# Patient Record
Sex: Female | Born: 1955 | Race: White | Hispanic: No | Smoking: Never smoker
Health system: Southern US, Community
[De-identification: ages and names within clinical notes are randomized; demographics above are authoritative.]

## PROBLEM LIST (undated history)

## (undated) DIAGNOSIS — I330 Acute and subacute infective endocarditis: Secondary | ICD-10-CM

## (undated) DIAGNOSIS — E785 Hyperlipidemia, unspecified: Secondary | ICD-10-CM

## (undated) DIAGNOSIS — G4733 Obstructive sleep apnea (adult) (pediatric): Secondary | ICD-10-CM

## (undated) DIAGNOSIS — I639 Cerebral infarction, unspecified: Secondary | ICD-10-CM

## (undated) DIAGNOSIS — E119 Type 2 diabetes mellitus without complications: Secondary | ICD-10-CM

## (undated) DIAGNOSIS — A4102 Sepsis due to Methicillin resistant Staphylococcus aureus: Secondary | ICD-10-CM

## (undated) DIAGNOSIS — J9621 Acute and chronic respiratory failure with hypoxia: Secondary | ICD-10-CM

## (undated) DIAGNOSIS — I2510 Atherosclerotic heart disease of native coronary artery without angina pectoris: Secondary | ICD-10-CM

## (undated) DIAGNOSIS — K279 Peptic ulcer, site unspecified, unspecified as acute or chronic, without hemorrhage or perforation: Secondary | ICD-10-CM

## (undated) DIAGNOSIS — I10 Essential (primary) hypertension: Secondary | ICD-10-CM

## (undated) HISTORY — PX: CARPAL TUNNEL RELEASE: SHX101

## (undated) HISTORY — PX: BELOW KNEE LEG AMPUTATION: SUR23

## (undated) HISTORY — PX: TRACHEOSTOMY: SUR1362

## (undated) HISTORY — PX: ANKLE FRACTURE SURGERY: SHX122

---

## 2018-03-10 ENCOUNTER — Ambulatory Visit (HOSPITAL_COMMUNITY)
Admission: AD | Admit: 2018-03-10 | Discharge: 2018-03-10 | Disposition: A | Discharge status: Home / Self Care | Payer: Medicaid Other | Source: Other Acute Inpatient Hospital | Attending: Internal Medicine | Admitting: Internal Medicine

## 2018-03-10 ENCOUNTER — Inpatient Hospital Stay
Admission: RE | Admit: 2018-03-10 | Discharge: 2018-03-30 | Disposition: A | Discharge status: Skilled Nursing Facility | Payer: 59 | Attending: Internal Medicine | Admitting: Internal Medicine

## 2018-03-10 ENCOUNTER — Other Ambulatory Visit (HOSPITAL_COMMUNITY): Payer: Self-pay

## 2018-03-10 DIAGNOSIS — I2510 Atherosclerotic heart disease of native coronary artery without angina pectoris: Secondary | ICD-10-CM

## 2018-03-10 DIAGNOSIS — J9621 Acute and chronic respiratory failure with hypoxia: Secondary | ICD-10-CM

## 2018-03-10 DIAGNOSIS — I330 Acute and subacute infective endocarditis: Secondary | ICD-10-CM

## 2018-03-10 DIAGNOSIS — Z4659 Encounter for fitting and adjustment of other gastrointestinal appliance and device: Secondary | ICD-10-CM

## 2018-03-10 DIAGNOSIS — J9600 Acute respiratory failure, unspecified whether with hypoxia or hypercapnia: Secondary | ICD-10-CM

## 2018-03-10 DIAGNOSIS — A4102 Sepsis due to Methicillin resistant Staphylococcus aureus: Secondary | ICD-10-CM

## 2018-03-10 DIAGNOSIS — J9690 Respiratory failure, unspecified, unspecified whether with hypoxia or hypercapnia: Secondary | ICD-10-CM | POA: Diagnosis present

## 2018-03-10 HISTORY — DX: Obstructive sleep apnea (adult) (pediatric): G47.33

## 2018-03-10 HISTORY — DX: Essential (primary) hypertension: I10

## 2018-03-10 HISTORY — DX: Sepsis due to methicillin resistant Staphylococcus aureus: A41.02

## 2018-03-10 HISTORY — DX: Peptic ulcer, site unspecified, unspecified as acute or chronic, without hemorrhage or perforation: K27.9

## 2018-03-10 HISTORY — DX: Cerebral infarction, unspecified: I63.9

## 2018-03-10 HISTORY — DX: Hyperlipidemia, unspecified: E78.5

## 2018-03-10 HISTORY — DX: Atherosclerotic heart disease of native coronary artery without angina pectoris: I25.10

## 2018-03-10 HISTORY — DX: Acute and subacute infective endocarditis: I33.0

## 2018-03-10 HISTORY — DX: Acute and chronic respiratory failure with hypoxia: J96.21

## 2018-03-10 HISTORY — DX: Type 2 diabetes mellitus without complications: E11.9

## 2018-03-10 LAB — BLOOD GAS, ARTERIAL
ACID-BASE EXCESS: 5.4 mmol/L — AB (ref 0.0–2.0)
Bicarbonate: 29.6 mmol/L — ABNORMAL HIGH (ref 20.0–28.0)
FIO2: 40
O2 Saturation: 99.1 %
PH ART: 7.43 (ref 7.350–7.450)
PRESSURE CONTROL: 16 cmH2O
Patient temperature: 98.6
RATE: 20 resp/min
pCO2 arterial: 45.4 mmHg (ref 32.0–48.0)
pO2, Arterial: 141 mmHg — ABNORMAL HIGH (ref 83.0–108.0)

## 2018-03-11 LAB — COMPREHENSIVE METABOLIC PANEL
ALBUMIN: 1.7 g/dL — AB (ref 3.5–5.0)
ALT: 20 U/L (ref 14–54)
ANION GAP: 10 (ref 5–15)
AST: 21 U/L (ref 15–41)
Alkaline Phosphatase: 825 U/L — ABNORMAL HIGH (ref 38–126)
BILIRUBIN TOTAL: 0.9 mg/dL (ref 0.3–1.2)
BUN: 31 mg/dL — ABNORMAL HIGH (ref 6–20)
CO2: 26 mmol/L (ref 22–32)
Calcium: 8.4 mg/dL — ABNORMAL LOW (ref 8.9–10.3)
Chloride: 105 mmol/L (ref 101–111)
Creatinine, Ser: 0.68 mg/dL (ref 0.44–1.00)
GFR calc Af Amer: >60 mL/min (ref >60)
GLUCOSE: 149 mg/dL — AB (ref 65–99)
POTASSIUM: 3.6 mmol/L (ref 3.5–5.1)
Sodium: 141 mmol/L (ref 135–145)
TOTAL PROTEIN: 5.8 g/dL — AB (ref 6.5–8.1)

## 2018-03-11 LAB — URINALYSIS, ROUTINE W REFLEX MICROSCOPIC
BILIRUBIN URINE: NEGATIVE
Glucose, UA: NEGATIVE mg/dL
Hgb urine dipstick: NEGATIVE
KETONES UR: NEGATIVE mg/dL
Nitrite: NEGATIVE
PH: 5 (ref 5.0–8.0)
Protein, ur: 30 mg/dL — AB
Specific Gravity, Urine: 1.016 (ref 1.005–1.030)

## 2018-03-11 LAB — CBC
HCT: 24.1 % — ABNORMAL LOW (ref 36.0–46.0)
Hemoglobin: 7.3 g/dL — ABNORMAL LOW (ref 12.0–15.0)
MCH: 29.4 pg (ref 26.0–34.0)
MCHC: 30.3 g/dL (ref 30.0–36.0)
MCV: 97.2 fL (ref 78.0–100.0)
Platelets: 352 K/uL (ref 150–400)
RBC: 2.48 MIL/uL — ABNORMAL LOW (ref 3.87–5.11)
RDW: 17.3 % — ABNORMAL HIGH (ref 11.5–15.5)
WBC: 9.8 K/uL (ref 4.0–10.5)

## 2018-03-11 LAB — BASIC METABOLIC PANEL
Anion gap: 8 (ref 5–15)
BUN: 31 mg/dL — AB (ref 6–20)
CO2: 27 mmol/L (ref 22–32)
CREATININE: 0.65 mg/dL (ref 0.44–1.00)
Calcium: 8.5 mg/dL — ABNORMAL LOW (ref 8.9–10.3)
Chloride: 108 mmol/L (ref 101–111)
GFR calc Af Amer: >60 mL/min (ref >60)
GFR calc non Af Amer: >60 mL/min (ref >60)
GLUCOSE: 153 mg/dL — AB (ref 65–99)
Potassium: 3.6 mmol/L (ref 3.5–5.1)
Sodium: 143 mmol/L (ref 135–145)

## 2018-03-11 LAB — C DIFFICILE QUICK SCREEN W PCR REFLEX
C Diff antigen: NEGATIVE
C Diff interpretation: NOT DETECTED
C Diff toxin: NEGATIVE

## 2018-03-11 LAB — PROTIME-INR
INR: 1.11
Prothrombin Time: 14.2 s (ref 11.4–15.2)

## 2018-03-12 ENCOUNTER — Other Ambulatory Visit (HOSPITAL_COMMUNITY): Payer: Self-pay

## 2018-03-12 LAB — URINE CULTURE: Culture: 80000 — AB

## 2018-03-13 ENCOUNTER — Other Ambulatory Visit (HOSPITAL_COMMUNITY): Payer: Self-pay

## 2018-03-13 LAB — CBC
HCT: 23.7 % — ABNORMAL LOW (ref 36.0–46.0)
HEMOGLOBIN: 7.4 g/dL — AB (ref 12.0–15.0)
MCH: 30.5 pg (ref 26.0–34.0)
MCHC: 31.2 g/dL (ref 30.0–36.0)
MCV: 97.5 fL (ref 78.0–100.0)
Platelets: 308 10*3/uL (ref 150–400)
RBC: 2.43 MIL/uL — ABNORMAL LOW (ref 3.87–5.11)
RDW: 17.1 % — ABNORMAL HIGH (ref 11.5–15.5)
WBC: 7.3 10*3/uL (ref 4.0–10.5)

## 2018-03-13 LAB — COMPREHENSIVE METABOLIC PANEL
ALBUMIN: 1.8 g/dL — AB (ref 3.5–5.0)
ALK PHOS: 762 U/L — AB (ref 38–126)
ALT: 19 U/L (ref 14–54)
AST: 20 U/L (ref 15–41)
Anion gap: 9 (ref 5–15)
BUN: 24 mg/dL — ABNORMAL HIGH (ref 6–20)
CALCIUM: 8.5 mg/dL — AB (ref 8.9–10.3)
CHLORIDE: 103 mmol/L (ref 101–111)
CO2: 28 mmol/L (ref 22–32)
CREATININE: 0.57 mg/dL (ref 0.44–1.00)
GFR calc Af Amer: >60 mL/min (ref >60)
GFR calc non Af Amer: >60 mL/min (ref >60)
GLUCOSE: 128 mg/dL — AB (ref 65–99)
Potassium: 3.7 mmol/L (ref 3.5–5.1)
SODIUM: 140 mmol/L (ref 135–145)
TOTAL PROTEIN: 5.7 g/dL — AB (ref 6.5–8.1)
Total Bilirubin: 0.7 mg/dL (ref 0.3–1.2)

## 2018-03-13 LAB — MAGNESIUM: MAGNESIUM: 1.8 mg/dL (ref 1.7–2.4)

## 2018-03-14 LAB — CULTURE, RESPIRATORY W GRAM STAIN

## 2018-03-14 LAB — BASIC METABOLIC PANEL
ANION GAP: 10 (ref 5–15)
BUN: 21 mg/dL — AB (ref 6–20)
CALCIUM: 8.8 mg/dL — AB (ref 8.9–10.3)
CO2: 28 mmol/L (ref 22–32)
Chloride: 102 mmol/L (ref 101–111)
Creatinine, Ser: 0.51 mg/dL (ref 0.44–1.00)
GFR calc Af Amer: >60 mL/min (ref >60)
Glucose, Bld: 134 mg/dL — ABNORMAL HIGH (ref 65–99)
POTASSIUM: 3.7 mmol/L (ref 3.5–5.1)
SODIUM: 140 mmol/L (ref 135–145)

## 2018-03-14 LAB — CBC
HCT: 23.7 % — ABNORMAL LOW (ref 36.0–46.0)
Hemoglobin: 7.3 g/dL — ABNORMAL LOW (ref 12.0–15.0)
MCH: 29.9 pg (ref 26.0–34.0)
MCHC: 30.8 g/dL (ref 30.0–36.0)
MCV: 97.1 fL (ref 78.0–100.0)
Platelets: 278 10*3/uL (ref 150–400)
RBC: 2.44 MIL/uL — AB (ref 3.87–5.11)
RDW: 16.7 % — ABNORMAL HIGH (ref 11.5–15.5)
WBC: 7.2 10*3/uL (ref 4.0–10.5)

## 2018-03-14 LAB — MAGNESIUM: MAGNESIUM: 1.7 mg/dL (ref 1.7–2.4)

## 2018-03-14 LAB — C-REACTIVE PROTEIN: CRP: 5 mg/dL — ABNORMAL HIGH (ref <1.0)

## 2018-03-14 LAB — PHOSPHORUS: Phosphorus: 3.3 mg/dL (ref 2.5–4.6)

## 2018-03-14 LAB — CULTURE, RESPIRATORY

## 2018-03-14 LAB — SEDIMENTATION RATE: SED RATE: 113 mm/h — AB (ref 0–22)

## 2018-03-16 LAB — BASIC METABOLIC PANEL
Anion gap: 9 (ref 5–15)
BUN: 21 mg/dL — AB (ref 6–20)
CALCIUM: 8.4 mg/dL — AB (ref 8.9–10.3)
CHLORIDE: 99 mmol/L — AB (ref 101–111)
CO2: 30 mmol/L (ref 22–32)
CREATININE: 0.55 mg/dL (ref 0.44–1.00)
GFR calc Af Amer: >60 mL/min (ref >60)
GFR calc non Af Amer: >60 mL/min (ref >60)
Glucose, Bld: 130 mg/dL — ABNORMAL HIGH (ref 65–99)
Potassium: 3.2 mmol/L — ABNORMAL LOW (ref 3.5–5.1)
Sodium: 138 mmol/L (ref 135–145)

## 2018-03-16 LAB — CULTURE, BLOOD (ROUTINE X 2)
CULTURE: NO GROWTH
Culture: NO GROWTH
Special Requests: ADEQUATE
Special Requests: ADEQUATE

## 2018-03-16 LAB — CBC
HCT: 21.8 % — ABNORMAL LOW (ref 36.0–46.0)
HEMOGLOBIN: 6.6 g/dL — AB (ref 12.0–15.0)
MCH: 28.2 pg (ref 26.0–34.0)
MCHC: 30.3 g/dL (ref 30.0–36.0)
MCV: 93.2 fL (ref 78.0–100.0)
Platelets: 265 10*3/uL (ref 150–400)
RBC: 2.34 MIL/uL — ABNORMAL LOW (ref 3.87–5.11)
RDW: 16 % — AB (ref 11.5–15.5)
WBC: 7.5 10*3/uL (ref 4.0–10.5)

## 2018-03-16 LAB — ABO/RH: ABO/RH(D): O POS

## 2018-03-16 LAB — MAGNESIUM: MAGNESIUM: 1.6 mg/dL — AB (ref 1.7–2.4)

## 2018-03-16 LAB — PHOSPHORUS: Phosphorus: 2.8 mg/dL (ref 2.5–4.6)

## 2018-03-16 LAB — PREPARE RBC (CROSSMATCH)

## 2018-03-17 ENCOUNTER — Encounter: Payer: Self-pay | Admitting: Internal Medicine

## 2018-03-17 DIAGNOSIS — J9621 Acute and chronic respiratory failure with hypoxia: Secondary | ICD-10-CM

## 2018-03-17 DIAGNOSIS — I2510 Atherosclerotic heart disease of native coronary artery without angina pectoris: Secondary | ICD-10-CM | POA: Diagnosis not present

## 2018-03-17 DIAGNOSIS — I330 Acute and subacute infective endocarditis: Secondary | ICD-10-CM | POA: Diagnosis not present

## 2018-03-17 DIAGNOSIS — I2583 Coronary atherosclerosis due to lipid rich plaque: Secondary | ICD-10-CM | POA: Diagnosis not present

## 2018-03-17 DIAGNOSIS — A4102 Sepsis due to Methicillin resistant Staphylococcus aureus: Secondary | ICD-10-CM

## 2018-03-17 HISTORY — DX: Acute and subacute infective endocarditis: I33.0

## 2018-03-17 HISTORY — DX: Atherosclerotic heart disease of native coronary artery without angina pectoris: I25.10

## 2018-03-17 HISTORY — DX: Sepsis due to methicillin resistant Staphylococcus aureus: A41.02

## 2018-03-17 HISTORY — DX: Acute and chronic respiratory failure with hypoxia: J96.21

## 2018-03-17 LAB — TYPE AND SCREEN
ABO/RH(D): O POS
Antibody Screen: NEGATIVE
UNIT DIVISION: 0
Unit division: 0

## 2018-03-17 LAB — BASIC METABOLIC PANEL
Anion gap: 13 (ref 5–15)
BUN: 22 mg/dL — AB (ref 6–20)
CHLORIDE: 98 mmol/L — AB (ref 101–111)
CO2: 27 mmol/L (ref 22–32)
Calcium: 8.4 mg/dL — ABNORMAL LOW (ref 8.9–10.3)
Creatinine, Ser: 0.47 mg/dL (ref 0.44–1.00)
GFR calc Af Amer: >60 mL/min (ref >60)
GFR calc non Af Amer: >60 mL/min (ref >60)
GLUCOSE: 123 mg/dL — AB (ref 65–99)
Potassium: 3.3 mmol/L — ABNORMAL LOW (ref 3.5–5.1)
Sodium: 138 mmol/L (ref 135–145)

## 2018-03-17 LAB — CBC
HEMATOCRIT: 27.8 % — AB (ref 36.0–46.0)
HEMOGLOBIN: 9.1 g/dL — AB (ref 12.0–15.0)
MCH: 29.7 pg (ref 26.0–34.0)
MCHC: 32.7 g/dL (ref 30.0–36.0)
MCV: 90.8 fL (ref 78.0–100.0)
Platelets: 229 10*3/uL (ref 150–400)
RBC: 3.06 MIL/uL — ABNORMAL LOW (ref 3.87–5.11)
RDW: 16.5 % — AB (ref 11.5–15.5)
WBC: 10 10*3/uL (ref 4.0–10.5)

## 2018-03-17 LAB — BPAM RBC
Blood Product Expiration Date: 201904162359
Blood Product Expiration Date: 201904162359
ISSUE DATE / TIME: 201903211509
ISSUE DATE / TIME: 201903211109
Unit Type and Rh: 5100
Unit Type and Rh: 5100

## 2018-03-17 LAB — MAGNESIUM: Magnesium: 1.7 mg/dL (ref 1.7–2.4)

## 2018-03-17 LAB — PHOSPHORUS: Phosphorus: 2.4 mg/dL — ABNORMAL LOW (ref 2.5–4.6)

## 2018-03-17 NOTE — Progress Notes (Signed)
Pulmonary Critical Care Medicine [facility] PULMONARY SERVICE  Date of Service: March 17, 2018  Follow Up Progress Note   [name]  RUE:[id number]  DOB: [birth year]   DOA: 03/10/2018  Referring Physician: Carron Curie, MD   HPI: Kathryn Vargas is a 62 y.o. female seen for follow up of Acute on Chronic Respiratory Failure.  Patient is weaning on her/T, doing fairly well.  Patient was able to complete 9 hours yesterday today the goal is for 12 hours   Review of Systems:   ROS performed and is unremarkable other than noted above.  Past Medical History:  Diagnosis Date  . Acute on chronic respiratory failure with hypoxia (HCC) 03/17/2018  . CAD (coronary artery disease) 03/17/2018  . DM (diabetes mellitus) (HCC)   . HTN (hypertension)   . Hyperlipemia   . 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 OSA (obstructive sleep apnea)   . PUD (peptic ulcer disease)   . Sepsis due to methicillin resistant Staphylococcus aureus (MRSA) (HCC) 03/17/2018  . Stroke (cerebrum) [facility])     Past Surgical History:  Procedure Laterality Date  . ANKLE FRACTURE SURGERY    . BELOW KNEE LEG AMPUTATION    . CARPAL TUNNEL RELEASE    . TRACHEOSTOMY      Social History:    reports that she has never smoked. She has never used smokeless tobacco. She reports that she drank alcohol. She reports that she has current or past drug history.  Family History: Non-Contributory to the present illness  Allergies no known allergies  Medications:  Reviewed on Rounds  Physical Exam:  Vitals: Temperature 97.6 pulse 78 respiratory rate 26 blood pressure 131/76 saturations 99%  Ventilator Settings off the ventilator R/T, currently on 20% oxygen  . General: Comfortable at this time . Eyes: Grossly normal lids, irises & conjunctiva . ENT: grossly tongue is normal . Neck: no obvious mass . Cardiovascular: S1-S2 normal no gallop . Respiratory: No rhonchi expansion equal . Abdomen: Soft and  nontender . Skin: no rash seen on limited exam . Musculoskeletal: not rigid . Psychiatric:unable to assess . Neurologic: no seizure no involuntary movements          Labs on Admission:  Basic Metabolic Panel: Recent Labs  Lab 03/11/18 1344 03/13/18 0819 03/14/18 0623 03/16/18 0516 03/17/18 0630  NA 141 140 140 138 138  K 3.6 3.7 3.7 3.2* 3.3*  CL 105 103 102 99* 98*  CO2 26 28 28 30 27   GLUCOSE 149* 128* 134* 130* 123*  BUN 31* 24* 21* 21* 22*  CREATININE 0.68 0.57 0.51 0.55 0.47  CALCIUM 8.4* 8.5* 8.8* 8.4* 8.4*  MG  --  1.8 1.7 1.6* 1.7  PHOS  --   --  3.3 2.8 2.4*    Liver Function Tests: Recent Labs  Lab 03/11/18 1344 03/13/18 0819  AST 21 20  ALT 20 19  ALKPHOS 825* 762*  BILITOT 0.9 0.7  PROT 5.8* 5.7*  ALBUMIN 1.7* 1.8*   No results for input(s): LIPASE, AMYLASE in the last 168 hours. No results for input(s): AMMONIA in the last 168 hours.  CBC: Recent Labs  Lab 03/11/18 0630 03/13/18 0819 03/14/18 0623 03/16/18 0516 03/17/18 0630  WBC 9.8 7.3 7.2 7.5 10.0  HGB 7.3* 7.4* 7.3* 6.6* 9.1*  HCT 24.1* 23.7* 23.7* 21.8* 27.8*  MCV 97.2 97.5 97.1 93.2 90.8  PLT 352 308 278 265 229    Cardiac Enzymes: No results for input(s): CKTOTAL, CKMB, CKMBINDEX, TROPONINI in the last  168 hours.  BNP (last 3 results) No results for input(s): BNP in the last 8760 hours.  ProBNP (last 3 results) No results for input(s): PROBNP in the last 8760 hours.   Radiological Exams on Admission: Dg Abd Portable 1v  Result Date: 03/13/2018 CLINICAL DATA:  Encounter for nasogastric (NG) tube placement EXAM: PORTABLE ABDOMEN - 1 VIEW COMPARISON:  Plain film of the abdomen dated 03/10/2018. FINDINGS: Enteric tube appears adequately positioned in the stomach. Tip is likely at the level of the midbody. No dilated large or small bowel loops appreciated. No evidence of free intraperitoneal air seen. IMPRESSION: Enteric tube is adequately positioned in the stomach, tip likely  within the region of the mid gastric body. Electronically Signed   By: Bary RichardStan  Maynard M.D.   On: 03/13/2018 19:50       Assessment/Plan Active Problems:   Acute on chronic respiratory failure with hypoxia (HCC)   Infective endocarditis   Sepsis due to methicillin resistant Staphylococcus aureus (MRSA) (HCC)   CAD (coronary artery disease)   1. Acute on chronic respiratory failure with hypoxia at this time patient is weaning as mentioned above the goal is for 12 hours on T collar.  Titrate oxygen as tolerated continue with pulmonary toilet 2. Infective endocarditis patient has been treated with antibiotics we will continue to monitor closely 3. Sepsis due to MRSA patient has been treated with antibiotics we will continue with supportive care 4. Coronary artery disease status post CABG we will continue to follow      I have personally seen and evaluated the patient, evaluated laboratory and imaging results, formulated the assessment and plan and placed orders. The Patient requires high complexity decision making for assessment and support.  Case was discussed on Rounds with the Respiratory Therapy Staff   Yevonne PaxSaadat A Daylynn Stumpp, MD [facility] Pulmonary Critical Care Medicine Sleep Medicine

## 2018-03-19 ENCOUNTER — Encounter (HOSPITAL_BASED_OUTPATIENT_CLINIC_OR_DEPARTMENT_OTHER): Payer: Self-pay

## 2018-03-19 DIAGNOSIS — M79609 Pain in unspecified limb: Secondary | ICD-10-CM

## 2018-03-19 LAB — CBC
HEMATOCRIT: 24.5 % — AB (ref 36.0–46.0)
HEMOGLOBIN: 7.8 g/dL — AB (ref 12.0–15.0)
MCH: 29.3 pg (ref 26.0–34.0)
MCHC: 31.8 g/dL (ref 30.0–36.0)
MCV: 92.1 fL (ref 78.0–100.0)
Platelets: 172 10*3/uL (ref 150–400)
RBC: 2.66 MIL/uL — AB (ref 3.87–5.11)
RDW: 16.1 % — ABNORMAL HIGH (ref 11.5–15.5)
WBC: 9.2 10*3/uL (ref 4.0–10.5)

## 2018-03-19 LAB — BASIC METABOLIC PANEL
ANION GAP: 11 (ref 5–15)
BUN: 24 mg/dL — ABNORMAL HIGH (ref 6–20)
CHLORIDE: 95 mmol/L — AB (ref 101–111)
CO2: 30 mmol/L (ref 22–32)
Calcium: 8.2 mg/dL — ABNORMAL LOW (ref 8.9–10.3)
Creatinine, Ser: 0.45 mg/dL (ref 0.44–1.00)
GFR calc Af Amer: >60 mL/min (ref >60)
GFR calc non Af Amer: >60 mL/min (ref >60)
GLUCOSE: 114 mg/dL — AB (ref 65–99)
POTASSIUM: 2.9 mmol/L — AB (ref 3.5–5.1)
Sodium: 136 mmol/L (ref 135–145)

## 2018-03-19 LAB — MAGNESIUM: Magnesium: 2.3 mg/dL (ref 1.7–2.4)

## 2018-03-19 LAB — PHOSPHORUS: Phosphorus: 1.9 mg/dL — ABNORMAL LOW (ref 2.5–4.6)

## 2018-03-19 LAB — POTASSIUM: POTASSIUM: 4 mmol/L (ref 3.5–5.1)

## 2018-03-19 NOTE — Progress Notes (Signed)
VASCULAR LAB PRELIMINARY  PRELIMINARY  PRELIMINARY  PRELIMINARY  Right upper extremity venous duplex completed.    Preliminary report:  There is no DVT noted in the right upper extremity.  There is subacute superficial thrombosis noted in the right cephalic vein from wrist to upper arm.  Letonya Mangels, RVT 03/19/2018, 5:10 PM

## 2018-03-20 DIAGNOSIS — I2583 Coronary atherosclerosis due to lipid rich plaque: Secondary | ICD-10-CM | POA: Diagnosis not present

## 2018-03-20 DIAGNOSIS — I330 Acute and subacute infective endocarditis: Secondary | ICD-10-CM | POA: Diagnosis not present

## 2018-03-20 DIAGNOSIS — J9621 Acute and chronic respiratory failure with hypoxia: Secondary | ICD-10-CM | POA: Diagnosis not present

## 2018-03-20 DIAGNOSIS — I2510 Atherosclerotic heart disease of native coronary artery without angina pectoris: Secondary | ICD-10-CM

## 2018-03-20 DIAGNOSIS — A4102 Sepsis due to Methicillin resistant Staphylococcus aureus: Secondary | ICD-10-CM | POA: Diagnosis not present

## 2018-03-20 LAB — BASIC METABOLIC PANEL
Anion gap: 9 (ref 5–15)
BUN: 24 mg/dL — ABNORMAL HIGH (ref 6–20)
CALCIUM: 8.5 mg/dL — AB (ref 8.9–10.3)
CO2: 31 mmol/L (ref 22–32)
CREATININE: 0.43 mg/dL — AB (ref 0.44–1.00)
Chloride: 96 mmol/L — ABNORMAL LOW (ref 101–111)
GFR calc Af Amer: >60 mL/min (ref >60)
Glucose, Bld: 135 mg/dL — ABNORMAL HIGH (ref 65–99)
POTASSIUM: 4.1 mmol/L (ref 3.5–5.1)
SODIUM: 136 mmol/L (ref 135–145)

## 2018-03-20 LAB — CBC
HCT: 25.9 % — ABNORMAL LOW (ref 36.0–46.0)
Hemoglobin: 8.1 g/dL — ABNORMAL LOW (ref 12.0–15.0)
MCH: 28.5 pg (ref 26.0–34.0)
MCHC: 31.3 g/dL (ref 30.0–36.0)
MCV: 91.2 fL (ref 78.0–100.0)
PLATELETS: 189 10*3/uL (ref 150–400)
RBC: 2.84 MIL/uL — ABNORMAL LOW (ref 3.87–5.11)
RDW: 15.7 % — AB (ref 11.5–15.5)
WBC: 9.3 10*3/uL (ref 4.0–10.5)

## 2018-03-20 LAB — MAGNESIUM: MAGNESIUM: 2 mg/dL (ref 1.7–2.4)

## 2018-03-20 LAB — PHOSPHORUS: Phosphorus: 2.3 mg/dL — ABNORMAL LOW (ref 2.5–4.6)

## 2018-03-20 NOTE — Progress Notes (Signed)
[facility]   PULMONARY SERVICE  PROGRESS NOTE  Date of Service:  March 20, 2018  Wonda Hornerereas Isabell  ZOX:[id number]:[id number]  DOB: 1956-02-19   DOA: 03/10/2018  Referring Physician: Carron CurieAli Hijazi, MD  HPI: Wonda Hornerereas Callejo is a 62 y.o. female seen for follow up of Acute on Chronic Respiratory Failure.  She is on pressure control this morning however has been weaning on aerosolized T collar.  She was able to complete 16 hr yesterday and so therefore today we should be able to advance 224 hr as tolerated  Medications: Reviewed on Rounds  Physical Exam:  Vitals:  Temperature 98.6 degrees pulse 91 respiratory 31 blood pressure 146/69 saturations 99%  Ventilator Settings  Currently on pressure control FiO2 28% tidal volume 423 peep 5  . General: Comfortable at this time . Eyes: Grossly normal lids, irises & conjunctiva . ENT: grossly tongue is normal . Neck: no obvious mass . Cardiovascular:  No gallops or rub . Respiratory:  clear . Abdomen:  soft . Skin: no rash seen on limited exam . Musculoskeletal: not rigid . Psychiatric:unable to assess . Neurologic: no seizure no involuntary movements         Labs on Admission:  Basic Metabolic Panel: Recent Labs  Lab 03/14/18 0623 03/16/18 0516 03/17/18 0630 03/19/18 0754 03/19/18 2006 03/20/18 0807  NA 140 138 138 136  --  136  K 3.7 3.2* 3.3* 2.9* 4.0 4.1  CL 102 99* 98* 95*  --  96*  CO2 28 30 27 30   --  31  GLUCOSE 134* 130* 123* 114*  --  135*  BUN 21* 21* 22* 24*  --  24*  CREATININE 0.51 0.55 0.47 0.45  --  0.43*  CALCIUM 8.8* 8.4* 8.4* 8.2*  --  8.5*  MG 1.7 1.6* 1.7 2.3  --  2.0  PHOS 3.3 2.8 2.4* 1.9*  --  2.3*    Liver Function Tests: No results for input(s): AST, ALT, ALKPHOS, BILITOT, PROT, ALBUMIN in the last 168 hours. No results for input(s): LIPASE, AMYLASE in the last 168 hours. No results for input(s): AMMONIA in the last 168 hours.  CBC: Recent Labs  Lab  03/14/18 0623 03/16/18 0516 03/17/18 0630 03/19/18 0754 03/20/18 0807  WBC 7.2 7.5 10.0 9.2 9.3  HGB 7.3* 6.6* 9.1* 7.8* 8.1*  HCT 23.7* 21.8* 27.8* 24.5* 25.9*  MCV 97.1 93.2 90.8 92.1 91.2  PLT 278 265 229 172 189    Cardiac Enzymes: No results for input(s): CKTOTAL, CKMB, CKMBINDEX, TROPONINI in the last 168 hours.  BNP (last 3 results) No results for input(s): BNP in the last 8760 hours.  ProBNP (last 3 results) No results for input(s): PROBNP in the last 8760 hours.  Radiological Exams on Admission: No results found.  Assessment/Plan Active Problems:   Acute on chronic respiratory failure with hypoxia (HCC)   Infective endocarditis   Sepsis due to methicillin resistant Staphylococcus aureus (MRSA) (HCC)   CAD (coronary artery disease)   1.  acute on chronic Respiratory failure with hypoxia will continue with weaning protocol.  Discussed on rounds with respiratory therapy to advance her to 24 hr as tolerated.  She seems to be doing well and I have confidence that she will continue with weaning without issues for now  2. Infective endocarditis treated with antibiotics we will continue to monitor 3. Sepsis due to MRSA treated with antibiotics we will continue to follow  4. coronary artery disease status post CABG  I have personally seen and evaluated the patient, evaluated laboratory and imaging results, formulated the assessment and plan and placed orders. The Patient requires high complexity decision making for assessment and support.  Case was discussed on Rounds with the Respiratory Therapy Staff  Allyne Gee, MD [facility] Pulmonary Critical Care Medicine Sleep Medicine

## 2018-03-21 ENCOUNTER — Other Ambulatory Visit (HOSPITAL_COMMUNITY): Payer: Self-pay

## 2018-03-22 DIAGNOSIS — I330 Acute and subacute infective endocarditis: Secondary | ICD-10-CM | POA: Diagnosis not present

## 2018-03-22 DIAGNOSIS — I2510 Atherosclerotic heart disease of native coronary artery without angina pectoris: Secondary | ICD-10-CM | POA: Diagnosis not present

## 2018-03-22 DIAGNOSIS — J9621 Acute and chronic respiratory failure with hypoxia: Secondary | ICD-10-CM | POA: Diagnosis not present

## 2018-03-22 DIAGNOSIS — A4102 Sepsis due to Methicillin resistant Staphylococcus aureus: Secondary | ICD-10-CM | POA: Diagnosis not present

## 2018-03-22 LAB — BASIC METABOLIC PANEL
ANION GAP: 10 (ref 5–15)
BUN: 23 mg/dL — AB (ref 6–20)
CALCIUM: 9.1 mg/dL (ref 8.9–10.3)
CO2: 31 mmol/L (ref 22–32)
Chloride: 97 mmol/L — ABNORMAL LOW (ref 101–111)
Creatinine, Ser: 0.49 mg/dL (ref 0.44–1.00)
GFR calc Af Amer: >60 mL/min (ref >60)
GFR calc non Af Amer: >60 mL/min (ref >60)
GLUCOSE: 91 mg/dL (ref 65–99)
POTASSIUM: 3.9 mmol/L (ref 3.5–5.1)
Sodium: 138 mmol/L (ref 135–145)

## 2018-03-22 NOTE — Progress Notes (Signed)
[facility]   PULMONARY SERVICE  PROGRESS NOTE  Date of Service:  March 22, 2018  Kathryn Vargas  JWJ:[id number]:[id number]  DOB: 1956-05-27   DOA: 03/10/2018  Referring Physician: Carron CurieAli Hijazi, MD  HPI: Kathryn Charterseresa Samford is a 62 y.o. female seen for follow up of Acute on Chronic Respiratory Failure.  She remains on aerosolized T collar is been doing well.  She has been off the ventilator for more than 48 hr.  Hopefully will be able to change her trach to a cuffless trach  Medications: Reviewed on Rounds  Physical Exam:  Vitals:  Temperature 97.1 degrees pulse 80 respiratory 20 blood pressure 137/72 saturations 97%  Ventilator Settings  Currently off the ventilator on aerosolized T collar FiO2 28%  . General: Comfortable at this time . Eyes: Grossly normal lids, irises & conjunctiva . ENT: grossly tongue is normal . Neck: no obvious mass . Cardiovascular:  S1-S2 normal no gallop . Respiratory:  No rhonchi expansion is equal . Abdomen:  Soft nontender . Skin: no rash seen on limited exam . Musculoskeletal: not rigid . Psychiatric:unable to assess . Neurologic: no seizure no involuntary movements         Labs on Admission:  Basic Metabolic Panel: Recent Labs  Lab 03/16/18 0516 03/17/18 0630 03/19/18 0754 03/19/18 2006 03/20/18 0807 03/22/18 0833  NA 138 138 136  --  136 138  K 3.2* 3.3* 2.9* 4.0 4.1 3.9  CL 99* 98* 95*  --  96* 97*  CO2 30 27 30   --  31 31  GLUCOSE 130* 123* 114*  --  135* 91  BUN 21* 22* 24*  --  24* 23*  CREATININE 0.55 0.47 0.45  --  0.43* 0.49  CALCIUM 8.4* 8.4* 8.2*  --  8.5* 9.1  MG 1.6* 1.7 2.3  --  2.0  --   PHOS 2.8 2.4* 1.9*  --  2.3*  --     Liver Function Tests: No results for input(s): AST, ALT, ALKPHOS, BILITOT, PROT, ALBUMIN in the last 168 hours. No results for input(s): LIPASE, AMYLASE in the last 168 hours. No results for input(s): AMMONIA in the last 168 hours.  CBC: Recent Labs   Lab 03/16/18 0516 03/17/18 0630 03/19/18 0754 03/20/18 0807  WBC 7.5 10.0 9.2 9.3  HGB 6.6* 9.1* 7.8* 8.1*  HCT 21.8* 27.8* 24.5* 25.9*  MCV 93.2 90.8 92.1 91.2  PLT 265 229 172 189    Cardiac Enzymes: No results for input(s): CKTOTAL, CKMB, CKMBINDEX, TROPONINI in the last 168 hours.  BNP (last 3 results) No results for input(s): BNP in the last 8760 hours.  ProBNP (last 3 results) No results for input(s): PROBNP in the last 8760 hours.  Radiological Exams on Admission: No results found.  Assessment/Plan Active Problems:   Acute on chronic respiratory failure with hypoxia (HCC)   Infective endocarditis   Sepsis due to methicillin resistant Staphylococcus aureus (MRSA) (HCC)   CAD (coronary artery disease)   1.  acute on chronic Respiratory failure with hypoxia will continue with aerosolized T collar and in the morning hopefully will change over 2 0 cuffless trach  2. Infective endocarditis treated with antibiotics we will continue to follow 3. Sepsis due to MRSA treated will continue to monitor next  4. coronary artery disease status post CABG   I have personally seen and evaluated the patient, evaluated laboratory and imaging results, formulated the assessment and plan and placed orders. The Patient requires high  complexity decision making for assessment and support.  Case was discussed on Rounds with the Respiratory Therapy Staff  Yevonne Pax, MD [facility] Pulmonary Critical Care Medicine Sleep Medicine

## 2018-03-23 DIAGNOSIS — J9621 Acute and chronic respiratory failure with hypoxia: Secondary | ICD-10-CM | POA: Diagnosis not present

## 2018-03-23 DIAGNOSIS — I330 Acute and subacute infective endocarditis: Secondary | ICD-10-CM | POA: Diagnosis not present

## 2018-03-23 DIAGNOSIS — I2510 Atherosclerotic heart disease of native coronary artery without angina pectoris: Secondary | ICD-10-CM | POA: Diagnosis not present

## 2018-03-23 DIAGNOSIS — A4102 Sepsis due to Methicillin resistant Staphylococcus aureus: Secondary | ICD-10-CM | POA: Diagnosis not present

## 2018-03-23 NOTE — Progress Notes (Signed)
[facility]   PULMONARY SERVICE  PROGRESS NOTE  Date of Service:  March 23, 2018  Kathryn Charterseresa Delorenzo  ZOX:[id number]:[id number]  DOB: [birth year]   DOA: 03/10/2018  Referring Physician: Carron CurieAli Hijazi, MD  HPI: Kathryn Vargas is a 62 y.o. female seen for follow up of Acute on Chronic Respiratory Failure.  Patient is continue to wean is on aerosolized T collar currently 28% oxygen good saturations are noted at this time.  She should be able to try to cap at this stage she has been tolerating the PMV fairly  Medications: Reviewed on Rounds  Physical Exam:  Vitals:  Temperature 98.9 degrees pulse 96 respiratory 28 blood pressure 135/74 saturations 100%  Ventilator Settings  Aerosolized T collar FiO2 28% with the PMV  . General: Comfortable at this time . Eyes: Grossly normal lids, irises & conjunctiva . ENT: grossly tongue is normal . Neck: no obvious mass . Cardiovascular:  S1-S2 normal no currently off . Respiratory:  Good aeration no rhonchi . Abdomen:  Soft nontender . Skin: no rash seen on limited exam . Musculoskeletal: not rigid . Psychiatric:unable to assess . Neurologic: no seizure no involuntary movements         Labs on Admission:  Basic Metabolic Panel: Recent Labs  Lab 03/17/18 0630 03/19/18 0754 03/19/18 2006 03/20/18 0807 03/22/18 0833  NA 138 136  --  136 138  K 3.3* 2.9* 4.0 4.1 3.9  CL 98* 95*  --  96* 97*  CO2 27 30  --  31 31  GLUCOSE 123* 114*  --  135* 91  BUN 22* 24*  --  24* 23*  CREATININE 0.47 0.45  --  0.43* 0.49  CALCIUM 8.4* 8.2*  --  8.5* 9.1  MG 1.7 2.3  --  2.0  --   PHOS 2.4* 1.9*  --  2.3*  --     Liver Function Tests: No results for input(s): AST, ALT, ALKPHOS, BILITOT, PROT, ALBUMIN in the last 168 hours. No results for input(s): LIPASE, AMYLASE in the last 168 hours. No results for input(s): AMMONIA in the last 168 hours.  CBC: Recent Labs  Lab 03/17/18 0630 03/19/18 0754  03/20/18 0807  WBC 10.0 9.2 9.3  HGB 9.1* 7.8* 8.1*  HCT 27.8* 24.5* 25.9*  MCV 90.8 92.1 91.2  PLT 229 172 189    Cardiac Enzymes: No results for input(s): CKTOTAL, CKMB, CKMBINDEX, TROPONINI in the last 168 hours.  BNP (last 3 results) No results for input(s): BNP in the last 8760 hours.  ProBNP (last 3 results) No results for input(s): PROBNP in the last 8760 hours.  Radiological Exams on Admission: No results found.  Assessment/Plan Active Problems:   Acute on chronic respiratory failure with hypoxia (HCC)   Infective endocarditis   Sepsis due to methicillin resistant Staphylococcus aureus (MRSA) (HCC)   CAD (coronary artery disease)   1.  acute on chronic Respiratory failure with hypoxia will continue weaning on aerosolized T collar will continue with pulmonary toilet secretion management 2. Infective endocarditis treated with antibiotics we will continue to follow 3. Coronary artery disease stable at this time  4. Sepsis resolved   I have personally seen and evaluated the patient, evaluated laboratory and imaging results, formulated the assessment and plan and placed orders. The Patient requires high complexity decision making for assessment and support.  Case was discussed on Rounds with the Respiratory Therapy Staff  Yevonne PaxSaadat A Jacquiline Zurcher, MD [facility] Pulmonary Critical Care Medicine Sleep  Medicine

## 2018-03-24 DIAGNOSIS — I330 Acute and subacute infective endocarditis: Secondary | ICD-10-CM | POA: Diagnosis not present

## 2018-03-24 DIAGNOSIS — I2510 Atherosclerotic heart disease of native coronary artery without angina pectoris: Secondary | ICD-10-CM | POA: Diagnosis not present

## 2018-03-24 DIAGNOSIS — A4102 Sepsis due to Methicillin resistant Staphylococcus aureus: Secondary | ICD-10-CM | POA: Diagnosis not present

## 2018-03-24 DIAGNOSIS — J9621 Acute and chronic respiratory failure with hypoxia: Secondary | ICD-10-CM | POA: Diagnosis not present

## 2018-03-24 NOTE — Progress Notes (Signed)
[facility]   PULMONARY SERVICE  PROGRESS NOTE  Date of Service: March 24, 2018  Rosalyn Charterseresa Shimel  ZOX:[id number]:[id number]  DOB: 1956-11-05   DOA: 03/10/2018  Referring Physician: Carron CurieAli Hijazi, MD  HPI: Rosalyn Charterseresa Redd is a 62 y.o. female seen for follow up of Acute on Chronic Respiratory Failure.  Patient remains on capping trials she looks good so far has been tolerating the capping without any problem so far  Medications: Reviewed on Rounds  Physical Exam:  Vitals: Temperature 97.4 pulse 88 Mr. rate 22 blood pressure 147/81 saturations 99%  Ventilator Settings off the ventilator   . General: Comfortable at this time . Eyes: Grossly normal lids, irises & conjunctiva . ENT: grossly tongue is normal . Neck: no obvious mass . Cardiovascular: S1-S2 normal no gallop . Respiratory: Clear . Abdomen: Soft nondistended . Skin: no rash seen on limited exam . Musculoskeletal: not rigid . Psychiatric:unable to assess . Neurologic: no seizure no involuntary movements         Labs on Admission:  Basic Metabolic Panel: Recent Labs  Lab 03/19/18 0754 03/19/18 2006 03/20/18 0807 03/22/18 0833  NA 136  --  136 138  K 2.9* 4.0 4.1 3.9  CL 95*  --  96* 97*  CO2 30  --  31 31  GLUCOSE 114*  --  135* 91  BUN 24*  --  24* 23*  CREATININE 0.45  --  0.43* 0.49  CALCIUM 8.2*  --  8.5* 9.1  MG 2.3  --  2.0  --   PHOS 1.9*  --  2.3*  --     Liver Function Tests: No results for input(s): AST, ALT, ALKPHOS, BILITOT, PROT, ALBUMIN in the last 168 hours. No results for input(s): LIPASE, AMYLASE in the last 168 hours. No results for input(s): AMMONIA in the last 168 hours.  CBC: Recent Labs  Lab 03/19/18 0754 03/20/18 0807  WBC 9.2 9.3  HGB 7.8* 8.1*  HCT 24.5* 25.9*  MCV 92.1 91.2  PLT 172 189    Cardiac Enzymes: No results for input(s): CKTOTAL, CKMB, CKMBINDEX, TROPONINI in the last 168 hours.  BNP (last 3 results) No results for  input(s): BNP in the last 8760 hours.  ProBNP (last 3 results) No results for input(s): PROBNP in the last 8760 hours.  Radiological Exams on Admission: No results found.  Assessment/Plan Active Problems:   Acute on chronic respiratory failure with hypoxia (HCC)   Infective endocarditis   Sepsis due to methicillin resistant Staphylococcus aureus (MRSA) (HCC)   CAD (coronary artery disease)   1. Acute on chronic respiratory failure with hypoxia we will continue with capping trials as tolerated patient right now is on 2 L nasal cannula 2. Infective endocarditis treated with antibiotics 3. Coronary artery disease status post CABG 4. Sepsis due to MRSA treated   I have personally seen and evaluated the patient, evaluated laboratory and imaging results, formulated the assessment and plan and placed orders. The Patient requires high complexity decision making for assessment and support.  Case was discussed on Rounds with the Respiratory Therapy Staff  Yevonne PaxSaadat A Aliz Meritt, MD [facility] Pulmonary Critical Care Medicine Sleep Medicine

## 2018-03-25 LAB — BASIC METABOLIC PANEL
ANION GAP: 13 (ref 5–15)
BUN: 19 mg/dL (ref 6–20)
CO2: 29 mmol/L (ref 22–32)
Calcium: 8.9 mg/dL (ref 8.9–10.3)
Chloride: 97 mmol/L — ABNORMAL LOW (ref 101–111)
Creatinine, Ser: 0.56 mg/dL (ref 0.44–1.00)
GFR calc Af Amer: >60 mL/min (ref >60)
Glucose, Bld: 145 mg/dL — ABNORMAL HIGH (ref 65–99)
POTASSIUM: 3.3 mmol/L — AB (ref 3.5–5.1)
Sodium: 139 mmol/L (ref 135–145)

## 2018-03-25 LAB — CBC
HCT: 26.2 % — ABNORMAL LOW (ref 36.0–46.0)
HEMOGLOBIN: 7.9 g/dL — AB (ref 12.0–15.0)
MCH: 28.3 pg (ref 26.0–34.0)
MCHC: 30.2 g/dL (ref 30.0–36.0)
MCV: 93.9 fL (ref 78.0–100.0)
PLATELETS: 271 10*3/uL (ref 150–400)
RBC: 2.79 MIL/uL — AB (ref 3.87–5.11)
RDW: 16.2 % — ABNORMAL HIGH (ref 11.5–15.5)
WBC: 9.2 10*3/uL (ref 4.0–10.5)

## 2018-03-25 LAB — PHOSPHORUS: PHOSPHORUS: 3.4 mg/dL (ref 2.5–4.6)

## 2018-03-25 LAB — MAGNESIUM: Magnesium: 1.6 mg/dL — ABNORMAL LOW (ref 1.7–2.4)

## 2018-03-26 DIAGNOSIS — A4102 Sepsis due to Methicillin resistant Staphylococcus aureus: Secondary | ICD-10-CM | POA: Diagnosis not present

## 2018-03-26 DIAGNOSIS — J9621 Acute and chronic respiratory failure with hypoxia: Secondary | ICD-10-CM | POA: Diagnosis not present

## 2018-03-26 DIAGNOSIS — I330 Acute and subacute infective endocarditis: Secondary | ICD-10-CM | POA: Diagnosis not present

## 2018-03-26 DIAGNOSIS — I2510 Atherosclerotic heart disease of native coronary artery without angina pectoris: Secondary | ICD-10-CM | POA: Diagnosis not present

## 2018-03-26 LAB — POTASSIUM: POTASSIUM: 4.1 mmol/L (ref 3.5–5.1)

## 2018-03-26 LAB — MAGNESIUM: Magnesium: 1.8 mg/dL (ref 1.7–2.4)

## 2018-03-26 NOTE — Progress Notes (Signed)
[facility]ulmonary Critical [facility]   PULMONARY SERVICE  PROGRESS NOTE  Date of Service: March 26, 2018  Kathryn Vargas  ZOX:[id number]:[id number]  DOB: 21-Jan-1956   DOA: 03/10/2018  Referring Physician: Carron CurieAli Hijazi, MD  HPI: Kathryn Vargas is a 62 y.o. female seen for follow up of Acute on Chronic Respiratory Failure.  Patient is Doing fairly well has been on 2 L she should be able to be decannulated.  On further questioning the patient did state that she has a history of sleep apnea and uses CPAP at home.  The family is going to try to obtain the pressure settings for CPAP and we should be started on CPAP prior to decannulation  Medications: Reviewed on Rounds  Physical Exam:  Vitals: Temperature 97.5 pulse 90 respiratory rate 20 blood pressure 133/73 saturation 97%  Ventilator Settings She Is on capping trials  . General: Comfortable at this time . Eyes: Grossly normal lids, irises & conjunctiva . ENT: grossly tongue is normal . Neck: no obvious mass . Cardiovascular: S1-S2 normal no gallop . Respiratory: Clear . Abdomen: Soft obese . Skin: no rash seen on limited exam . Musculoskeletal: not rigid . Psychiatric:unable to assess . Neurologic: no seizure no involuntary movements         Labs on Admission:  Basic Metabolic Panel: Recent Labs  Lab 03/19/18 2006 03/20/18 0807 03/22/18 0833 03/25/18 0535 03/26/18 0512  NA  --  136 138 139  --   K 4.0 4.1 3.9 3.3* 4.1  CL  --  96* 97* 97*  --   CO2  --  31 31 29   --   GLUCOSE  --  135* 91 145*  --   BUN  --  24* 23* 19  --   CREATININE  --  0.43* 0.49 0.56  --   CALCIUM  --  8.5* 9.1 8.9  --   MG  --  2.0  --  1.6*  --   PHOS  --  2.3*  --  3.4  --     Liver Function Tests: No results for input(s): AST, ALT, ALKPHOS, BILITOT, PROT, ALBUMIN in the last 168 hours. No results for input(s): LIPASE, AMYLASE in the last 168 hours. No results for input(s): AMMONIA in the last 168  hours.  CBC: Recent Labs  Lab 03/20/18 0807 03/25/18 0535  WBC 9.3 9.2  HGB 8.1* 7.9*  HCT 25.9* 26.2*  MCV 91.2 93.9  PLT 189 271    Cardiac Enzymes: No results for input(s): CKTOTAL, CKMB, CKMBINDEX, TROPONINI in the last 168 hours.  BNP (last 3 results) No results for input(s): BNP in the last 8760 hours.  ProBNP (last 3 results) No results for input(s): PROBNP in the last 8760 hours.  Radiological Exams on Admission: No results found.  Assessment/Plan Active Problems:   Acute on chronic respiratory failure with hypoxia (HCC)   Infective endocarditis   Sepsis due to methicillin resistant Staphylococcus aureus (MRSA) (HCC)   CAD (coronary artery disease)   1. Acute on chronic respiratory failure with hypoxia at this time patient is doing fine has been tolerating capping trials we should be able to proceed to decannulation 2. Infective endocarditis treated with antibiotics clinically improved 3. Sepsis clinically improved 4. Coronary artery disease stable   I have personally seen and evaluated the patient, evaluated laboratory and imaging results, formulated the assessment and plan and placed orders. The Patient requires high complexity decision making for assessment and support.  Case  was discussed on Rounds with the Respiratory Therapy Staff  Allyne Gee, MD [employer] Pulmonary Critical Care Medicine Sleep Medicine

## 2018-03-27 DIAGNOSIS — I2510 Atherosclerotic heart disease of native coronary artery without angina pectoris: Secondary | ICD-10-CM | POA: Diagnosis not present

## 2018-03-27 DIAGNOSIS — A4102 Sepsis due to Methicillin resistant Staphylococcus aureus: Secondary | ICD-10-CM | POA: Diagnosis not present

## 2018-03-27 DIAGNOSIS — J9621 Acute and chronic respiratory failure with hypoxia: Secondary | ICD-10-CM | POA: Diagnosis not present

## 2018-03-27 DIAGNOSIS — I330 Acute and subacute infective endocarditis: Secondary | ICD-10-CM | POA: Diagnosis not present

## 2018-03-27 NOTE — Progress Notes (Signed)
[facility]   PULMONARY SERVICE  PROGRESS NOTE  Date of Service:   March 27, 2018  Kathryn Vargas  ZOX:[id number]  DOB: 1956-07-15   DOA: 03/10/2018  Referring Physician: Carron CurieAli Hijazi, MD  HPI: Kathryn Charterseresa Plantz is a 62 y.o. female seen for follow up of Acute on Chronic Respiratory Failure.  Patient is capped she looks great she has been requiring about 2 L nasal cannula tolerating it very well.  Has had no significant desaturations secretions are well controlled  Medications: Reviewed on Rounds  Physical Exam:  Vitals:  Temperature 97.1 degrees pulse 90 respiratory 22 blood pressure 158/78 saturations 99%  Ventilator Settings  Capped 2 L nasal cannula  . General: Comfortable at this time . Eyes: Grossly normal lids, irises & conjunctiva . ENT: grossly tongue is normal . Neck: no obvious mass . Cardiovascular:  S1-S2 normal no gallop or rub noted . Respiratory:  No rhonchi expansion is equal . Abdomen:  Soft nontender . Skin: no rash seen on limited exam . Musculoskeletal: not rigid . Psychiatric:unable to assess . Neurologic: no seizure no involuntary movements         Labs on Admission:  Basic Metabolic Panel: Recent Labs  Lab 03/22/18 0833 03/25/18 0535 03/26/18 0512 03/26/18 1215  NA 138 139  --   --   K 3.9 3.3* 4.1  --   CL 97* 97*  --   --   CO2 31 29  --   --   GLUCOSE 91 145*  --   --   BUN 23* 19  --   --   CREATININE 0.49 0.56  --   --   CALCIUM 9.1 8.9  --   --   MG  --  1.6*  --  1.8  PHOS  --  3.4  --   --     Liver Function Tests: No results for input(s): AST, ALT, ALKPHOS, BILITOT, PROT, ALBUMIN in the last 168 hours. No results for input(s): LIPASE, AMYLASE in the last 168 hours. No results for input(s): AMMONIA in the last 168 hours.  CBC: Recent Labs  Lab 03/25/18 0535  WBC 9.2  HGB 7.9*  HCT 26.2*  MCV 93.9  PLT 271    Cardiac Enzymes: No results for input(s): CKTOTAL, CKMB,  CKMBINDEX, TROPONINI in the last 168 hours.  BNP (last 3 results) No results for input(s): BNP in the last 8760 hours.  ProBNP (last 3 results) No results for input(s): PROBNP in the last 8760 hours.  Radiological Exams on Admission: No results found.  Assessment/Plan Active Problems:   Acute on chronic respiratory failure with hypoxia (HCC)   Infective endocarditis   Sepsis due to methicillin resistant Staphylococcus aureus (MRSA) (HCC)   CAD (coronary artery disease)   1.  acute on chronic Respiratory failure with hypoxiaShe has done very well with the weaning.  We will go ahead and proceed to decannulation at this time. 2. Infective endocarditis treated with antibiotics we will continue to monitor 3. Sepsis due to MRSA has been resolved 4. Coronary artery disease stable at this time   I have personally seen and evaluated the patient, evaluated laboratory and imaging results, formulated the assessment and plan and placed orders. The Patient requires high complexity decision making for assessment and support.  Case was discussed on Rounds with the Respiratory Therapy Staff  Yevonne PaxSaadat A Khan, MD [facility] Pulmonary Critical Care Medicine Sleep Medicine

## 2018-03-28 DIAGNOSIS — A4102 Sepsis due to Methicillin resistant Staphylococcus aureus: Secondary | ICD-10-CM | POA: Diagnosis not present

## 2018-03-28 DIAGNOSIS — I2510 Atherosclerotic heart disease of native coronary artery without angina pectoris: Secondary | ICD-10-CM | POA: Diagnosis not present

## 2018-03-28 DIAGNOSIS — I330 Acute and subacute infective endocarditis: Secondary | ICD-10-CM | POA: Diagnosis not present

## 2018-03-28 DIAGNOSIS — J9621 Acute and chronic respiratory failure with hypoxia: Secondary | ICD-10-CM | POA: Diagnosis not present

## 2018-03-28 LAB — CBC
HEMATOCRIT: 26.9 % — AB (ref 36.0–46.0)
HEMOGLOBIN: 8 g/dL — AB (ref 12.0–15.0)
MCH: 28.2 pg (ref 26.0–34.0)
MCHC: 29.7 g/dL — ABNORMAL LOW (ref 30.0–36.0)
MCV: 94.7 fL (ref 78.0–100.0)
PLATELETS: 319 10*3/uL (ref 150–400)
RBC: 2.84 MIL/uL — AB (ref 3.87–5.11)
RDW: 16.5 % — ABNORMAL HIGH (ref 11.5–15.5)
WBC: 10.2 10*3/uL (ref 4.0–10.5)

## 2018-03-28 LAB — MAGNESIUM: MAGNESIUM: 1.6 mg/dL — AB (ref 1.7–2.4)

## 2018-03-28 LAB — RENAL FUNCTION PANEL
ANION GAP: 11 (ref 5–15)
Albumin: 2 g/dL — ABNORMAL LOW (ref 3.5–5.0)
BUN: 18 mg/dL (ref 6–20)
CHLORIDE: 93 mmol/L — AB (ref 101–111)
CO2: 31 mmol/L (ref 22–32)
CREATININE: 0.59 mg/dL (ref 0.44–1.00)
Calcium: 8.6 mg/dL — ABNORMAL LOW (ref 8.9–10.3)
GFR calc non Af Amer: >60 mL/min (ref >60)
Glucose, Bld: 163 mg/dL — ABNORMAL HIGH (ref 65–99)
POTASSIUM: 4.3 mmol/L (ref 3.5–5.1)
Phosphorus: 3.6 mg/dL (ref 2.5–4.6)
Sodium: 135 mmol/L (ref 135–145)

## 2018-03-28 NOTE — Progress Notes (Signed)
[facility]   PULMONARY SERVICE  PROGRESS NOTE  Date of Service: March 28, 2018  Kathryn Vargas  [id number]:[id number]  DOB: 04/21/1956   DOA: 03/10/2018  Referring Physician: Carron CurieAli Hijazi, MD  HPI: Kathryn Charterseresa Murgia is a 62 y.o. female seen for follow up of Acute on Chronic Respiratory Failure.  Is doing well was decannulated yesterday and now is on 1 L nasal cannula no issues  Medications: Reviewed on Rounds  Physical Exam:  Vitals: Temperature 98.1 pulse 79 respiratory rate 20 blood pressure 140/76 saturations 98%  Ventilator Settings off the ventilator  . General: Comfortable at this time . Eyes: Grossly normal lids, irises & conjunctiva . ENT: grossly tongue is normal . Neck: no obvious mass . Cardiovascular: S1-S2 normal no gallop . Respiratory: No rhonchi expansion equal . Abdomen: Soft nondistended . Skin: no rash seen on limited exam . Musculoskeletal: not rigid . Psychiatric:unable to assess . Neurologic: no seizure no involuntary movements         Labs on Admission:  Basic Metabolic Panel: Recent Labs  Lab 03/22/18 0833 03/25/18 0535 03/26/18 0512 03/26/18 1215 03/28/18 0625  NA 138 139  --   --  135  K 3.9 3.3* 4.1  --  4.3  CL 97* 97*  --   --  93*  CO2 31 29  --   --  31  GLUCOSE 91 145*  --   --  163*  BUN 23* 19  --   --  18  CREATININE 0.49 0.56  --   --  0.59  CALCIUM 9.1 8.9  --   --  8.6*  MG  --  1.6*  --  1.8 1.6*  PHOS  --  3.4  --   --  3.6    Liver Function Tests: Recent Labs  Lab 03/28/18 0625  ALBUMIN 2.0*   No results for input(s): LIPASE, AMYLASE in the last 168 hours. No results for input(s): AMMONIA in the last 168 hours.  CBC: Recent Labs  Lab 03/25/18 0535 03/28/18 0625  WBC 9.2 10.2  HGB 7.9* 8.0*  HCT 26.2* 26.9*  MCV 93.9 94.7  PLT 271 319    Cardiac Enzymes: No results for input(s): CKTOTAL, CKMB, CKMBINDEX, TROPONINI in the last 168 hours.  BNP (last 3  results) No results for input(s): BNP in the last 8760 hours.  ProBNP (last 3 results) No results for input(s): PROBNP in the last 8760 hours.  Radiological Exams on Admission: No results found.  Assessment/Plan Active Problems:   Acute on chronic respiratory failure with hypoxia (HCC)   Infective endocarditis   Sepsis due to methicillin resistant Staphylococcus aureus (MRSA) (HCC)   CAD (coronary artery disease)   1. Acute on chronic respiratory failure resolved patient is consents fully D cannulated we will continue with supportive care titrate oxygen as necessary 2. Infective endocarditis resolved we will continue 3. Coronary artery disease stable at this time 4. Sepsis due to MRSA clinically resolved   I have personally seen and evaluated the patient, evaluated laboratory and imaging results, formulated the assessment and plan and placed orders. The Patient requires high complexity decision making for assessment and support.  Case was discussed on Rounds with the Respiratory Therapy Staff  Yevonne PaxSaadat A Khan, MD [facility] Pulmonary Critical Care Medicine Sleep Medicine

## 2018-12-14 DIAGNOSIS — R7989 Other specified abnormal findings of blood chemistry: Secondary | ICD-10-CM

## 2018-12-14 DIAGNOSIS — L89159 Pressure ulcer of sacral region, unspecified stage: Secondary | ICD-10-CM | POA: Diagnosis not present

## 2018-12-14 DIAGNOSIS — I509 Heart failure, unspecified: Secondary | ICD-10-CM

## 2018-12-14 DIAGNOSIS — E871 Hypo-osmolality and hyponatremia: Secondary | ICD-10-CM

## 2018-12-14 DIAGNOSIS — I361 Nonrheumatic tricuspid (valve) insufficiency: Secondary | ICD-10-CM

## 2018-12-14 DIAGNOSIS — N179 Acute kidney failure, unspecified: Secondary | ICD-10-CM

## 2018-12-14 DIAGNOSIS — J189 Pneumonia, unspecified organism: Secondary | ICD-10-CM

## 2018-12-14 DIAGNOSIS — A0472 Enterocolitis due to Clostridium difficile, not specified as recurrent: Secondary | ICD-10-CM

## 2018-12-15 DIAGNOSIS — A0472 Enterocolitis due to Clostridium difficile, not specified as recurrent: Secondary | ICD-10-CM | POA: Diagnosis not present

## 2018-12-15 DIAGNOSIS — R7989 Other specified abnormal findings of blood chemistry: Secondary | ICD-10-CM | POA: Diagnosis not present

## 2019-01-15 ENCOUNTER — Observation Stay
Admission: EM | Admit: 2019-01-15 | Payer: Self-pay | Source: Other Acute Inpatient Hospital | Admitting: Internal Medicine

## 2019-02-11 DIAGNOSIS — E785 Hyperlipidemia, unspecified: Secondary | ICD-10-CM

## 2019-02-12 DIAGNOSIS — E785 Hyperlipidemia, unspecified: Secondary | ICD-10-CM | POA: Diagnosis not present

## 2019-03-16 DIAGNOSIS — N179 Acute kidney failure, unspecified: Secondary | ICD-10-CM

## 2019-03-16 DIAGNOSIS — R55 Syncope and collapse: Secondary | ICD-10-CM

## 2019-03-16 DIAGNOSIS — R079 Chest pain, unspecified: Secondary | ICD-10-CM | POA: Diagnosis not present

## 2019-03-16 DIAGNOSIS — E871 Hypo-osmolality and hyponatremia: Secondary | ICD-10-CM

## 2019-03-16 DIAGNOSIS — E875 Hyperkalemia: Secondary | ICD-10-CM

## 2019-03-16 DIAGNOSIS — R9431 Abnormal electrocardiogram [ECG] [EKG]: Secondary | ICD-10-CM

## 2019-03-16 DIAGNOSIS — E119 Type 2 diabetes mellitus without complications: Secondary | ICD-10-CM

## 2019-03-17 DIAGNOSIS — R55 Syncope and collapse: Secondary | ICD-10-CM | POA: Diagnosis not present

## 2019-03-17 DIAGNOSIS — E119 Type 2 diabetes mellitus without complications: Secondary | ICD-10-CM | POA: Diagnosis not present

## 2019-03-17 DIAGNOSIS — N179 Acute kidney failure, unspecified: Secondary | ICD-10-CM | POA: Diagnosis not present

## 2019-03-17 DIAGNOSIS — R9431 Abnormal electrocardiogram [ECG] [EKG]: Secondary | ICD-10-CM | POA: Diagnosis not present

## 2019-03-17 DIAGNOSIS — E875 Hyperkalemia: Secondary | ICD-10-CM | POA: Diagnosis not present

## 2019-03-18 DIAGNOSIS — R9431 Abnormal electrocardiogram [ECG] [EKG]: Secondary | ICD-10-CM | POA: Diagnosis not present

## 2019-03-18 DIAGNOSIS — R079 Chest pain, unspecified: Secondary | ICD-10-CM | POA: Diagnosis not present

## 2019-03-18 DIAGNOSIS — N179 Acute kidney failure, unspecified: Secondary | ICD-10-CM | POA: Diagnosis not present

## 2019-03-18 DIAGNOSIS — E119 Type 2 diabetes mellitus without complications: Secondary | ICD-10-CM | POA: Diagnosis not present

## 2019-03-19 DIAGNOSIS — I951 Orthostatic hypotension: Secondary | ICD-10-CM

## 2019-03-19 DIAGNOSIS — Z8673 Personal history of transient ischemic attack (TIA), and cerebral infarction without residual deficits: Secondary | ICD-10-CM

## 2019-03-19 DIAGNOSIS — R0789 Other chest pain: Secondary | ICD-10-CM

## 2019-03-19 DIAGNOSIS — R198 Other specified symptoms and signs involving the digestive system and abdomen: Secondary | ICD-10-CM

## 2019-05-09 IMAGING — DX DG ABD PORTABLE 1V
1 series · 1 of 1 positions shown · non-contrast
Comparison: None.

CLINICAL DATA: Initial evaluation for NG tube placement.

EXAM:
PORTABLE ABDOMEN - 1 VIEW

[abdomen]
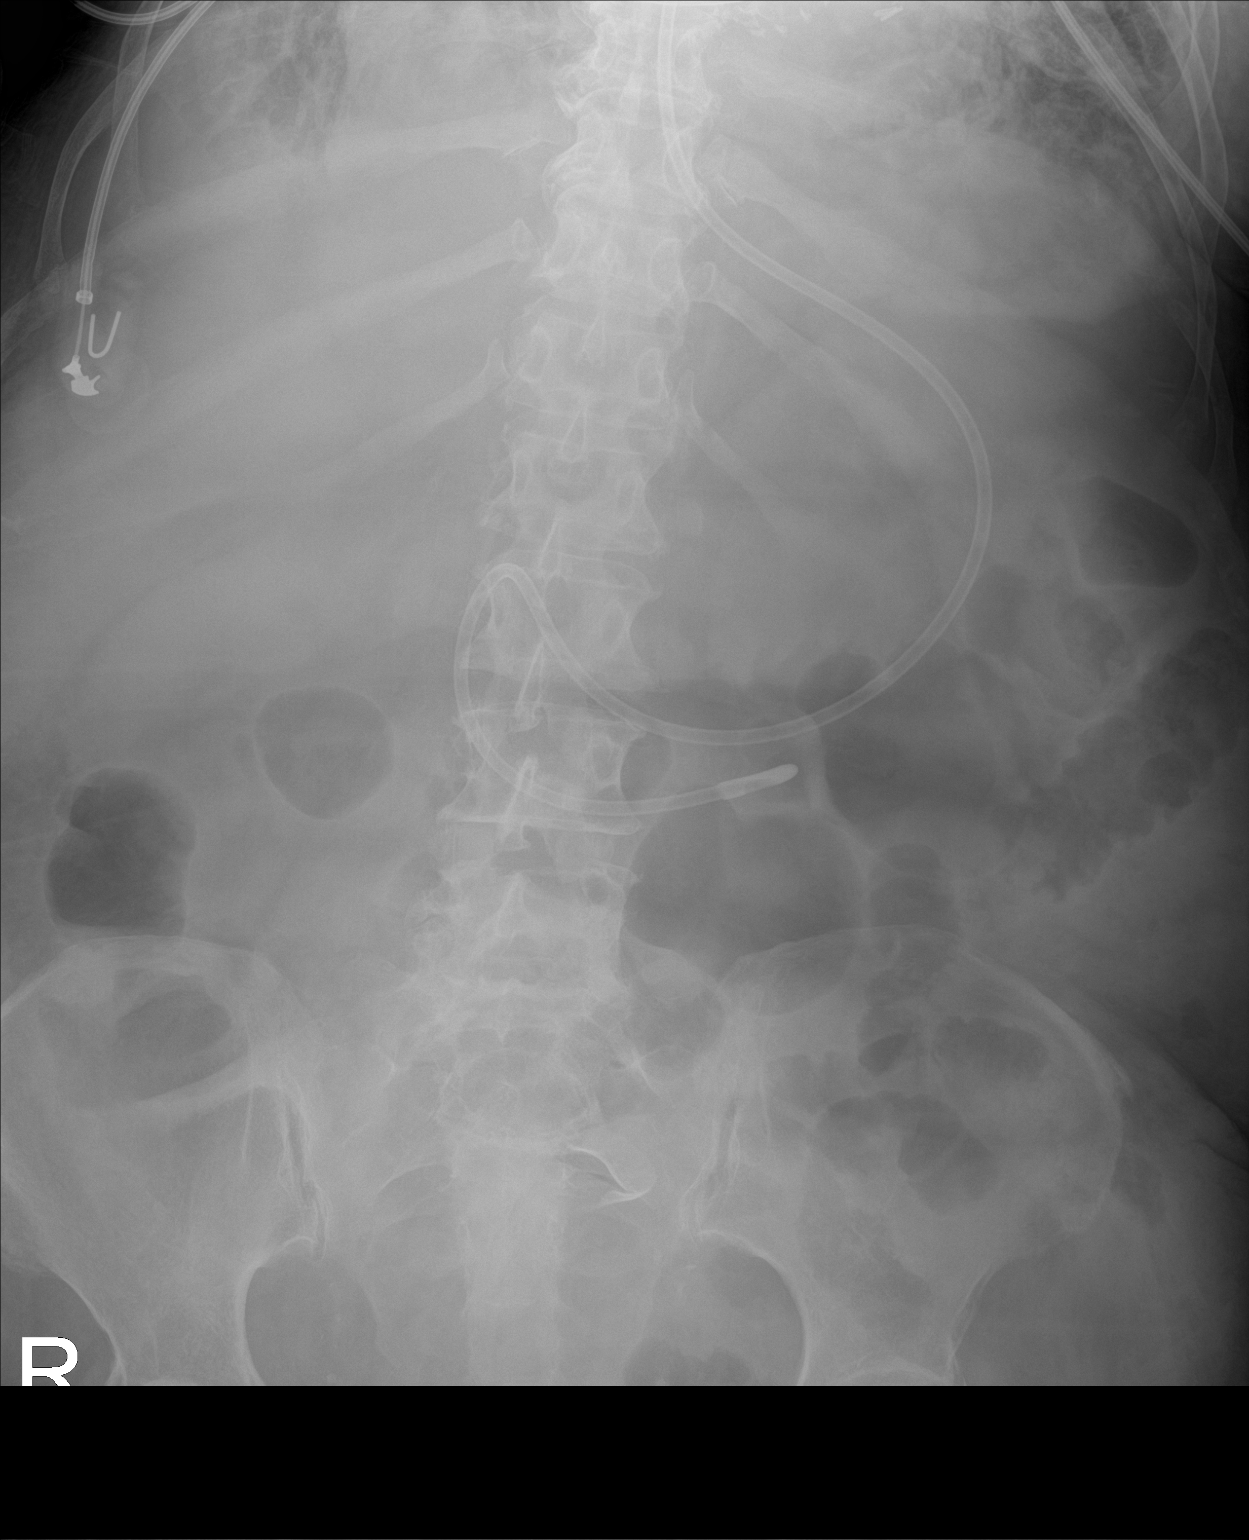

[1 of 1 positions shown; findings below may reference images not displayed]

FINDINGS: Weighted feeding tube in place, seen following the expected course
of the duodenal sweep, tip overlying the fourth portion of the
duodenum. Visualized bowel gas pattern within normal limits. No
acute abnormality identified within the abdomen. Degenerative
changes noted within lower lumbar spine.
IMPRESSION: Feeding tube in place with tip overlying the duodenum as above.

## 2019-05-11 IMAGING — DX DG CHEST 1V PORT
1 series · 1 of 1 positions shown · non-contrast
Comparison: None.

CLINICAL DATA: Respiratory failure

EXAM:
PORTABLE CHEST 1 VIEW

[chest ap]
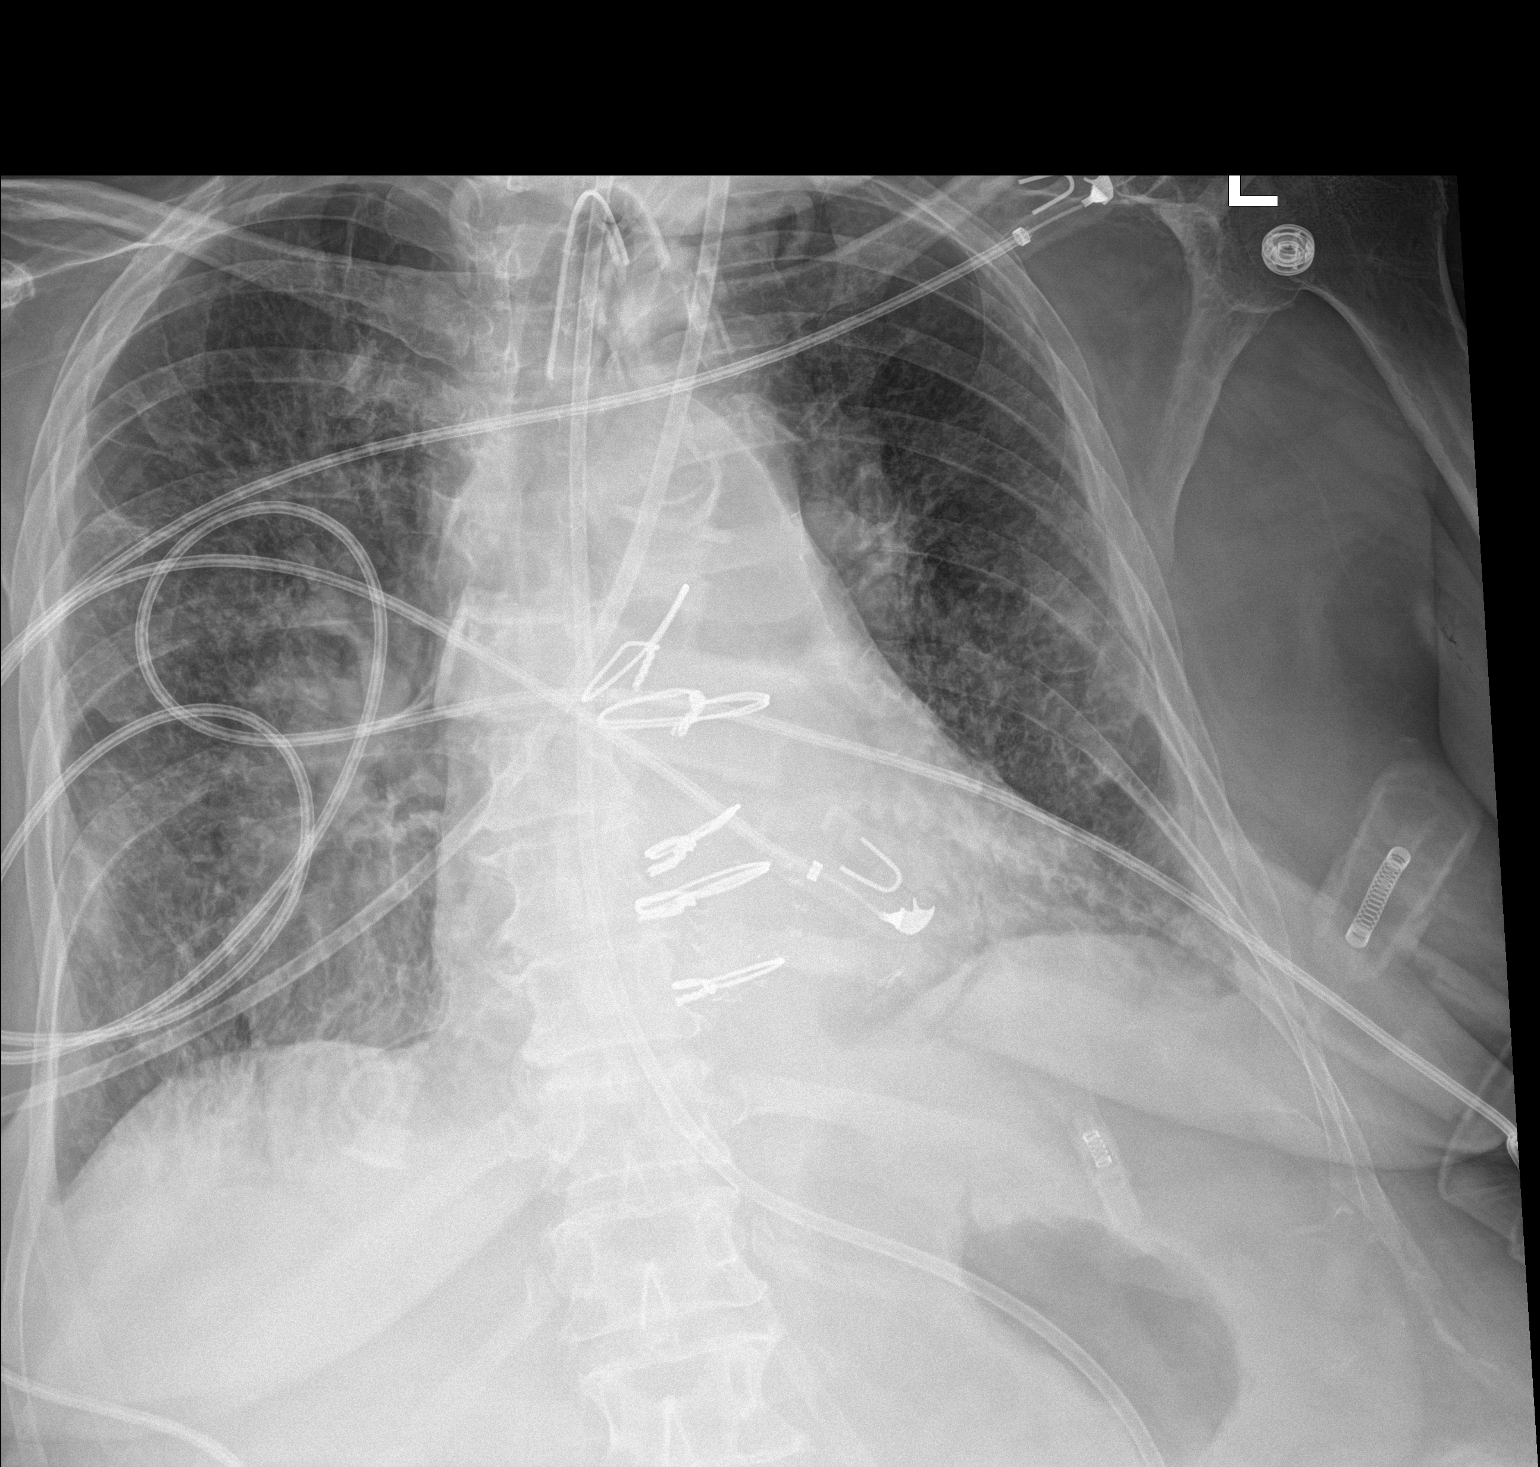

[1 of 1 positions shown; findings below may reference images not displayed]

FINDINGS: Tracheostomy tube and enteric tube good position. Normal cardiac
silhouette. Bibasilar atelectasis. Mild interstitial edema pattern.
No pneumothorax
IMPRESSION: 1. Support apparatus in good position.
2. Mild interstitial edema pattern

## 2019-05-20 IMAGING — RF DG SWALLOWING FUNCTION - NRPT [facility]
1 series · 18 of 24 positions shown · non-contrast
Comparison: none

[Series 1: run · 9 acquisitions, 18 frames shown]
[im 1/9]
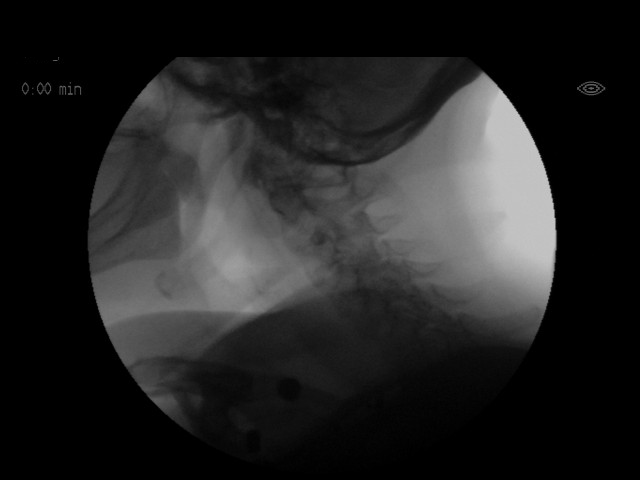
[im 1/9]
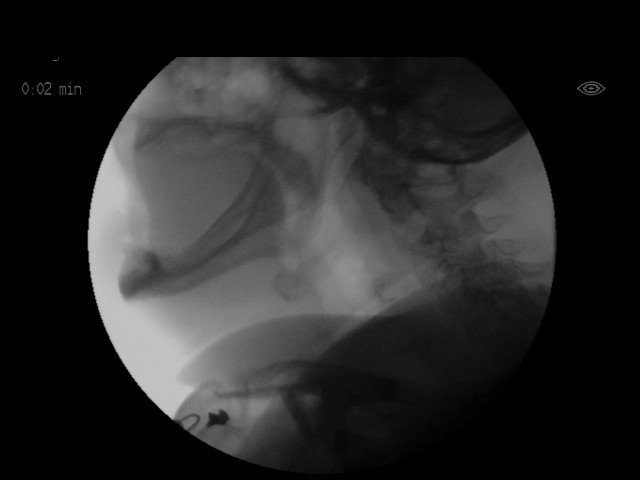
[im 2/9]
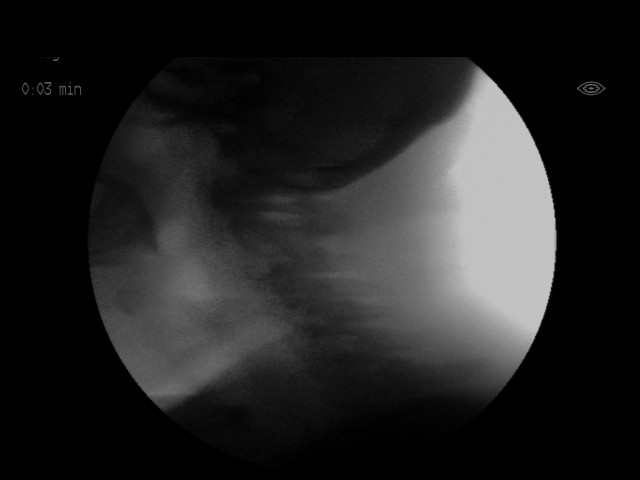
[im 2/9]
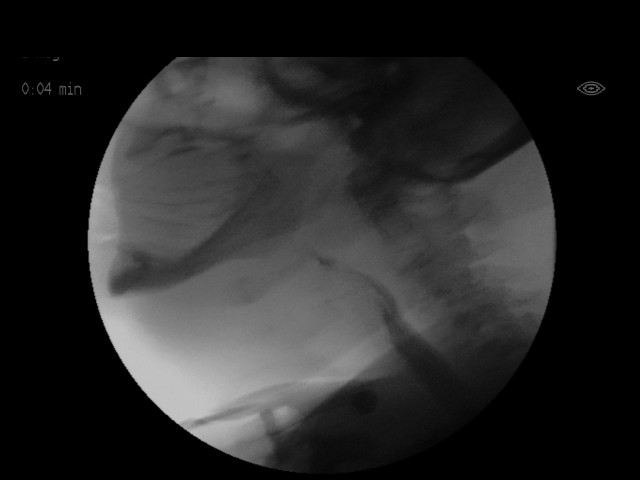
[im 3/9]
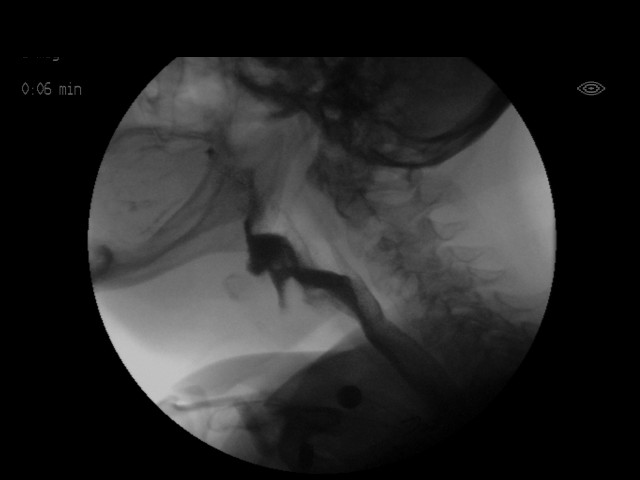
[im 3/9]
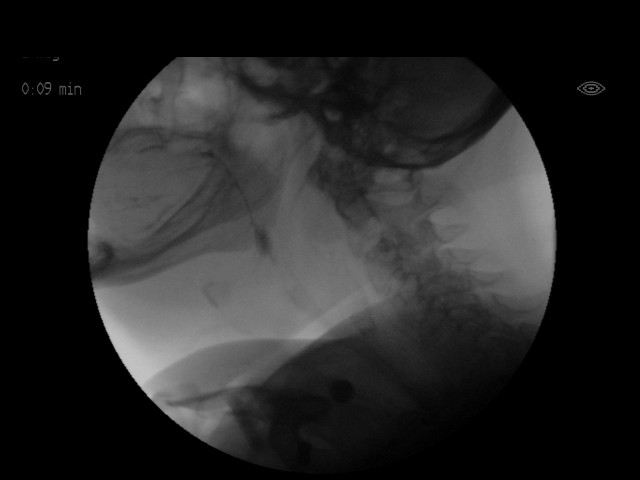
[im 4/9]
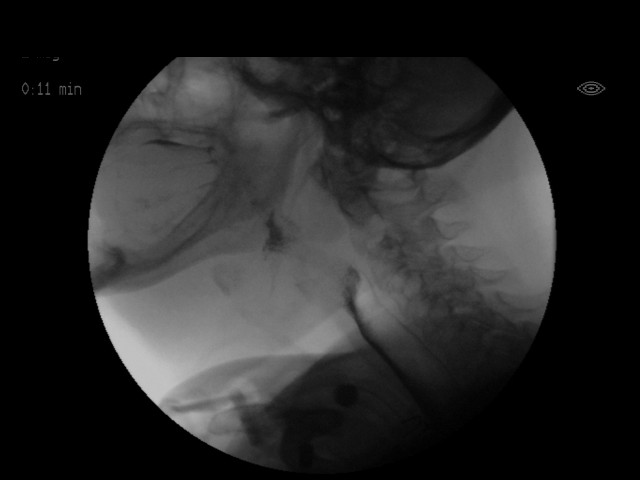
[im 4/9]
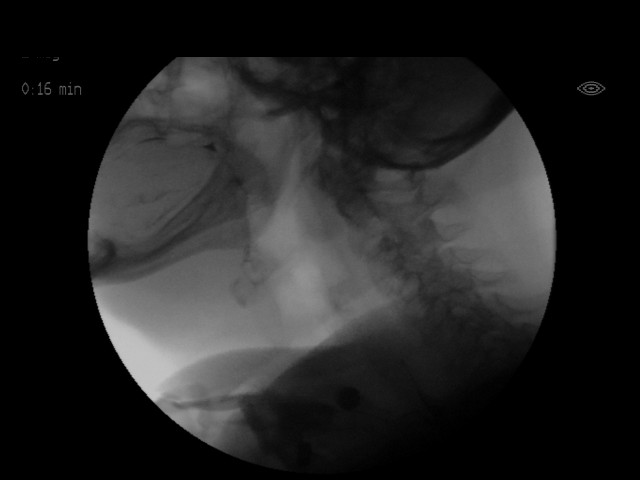
[im 5/9]
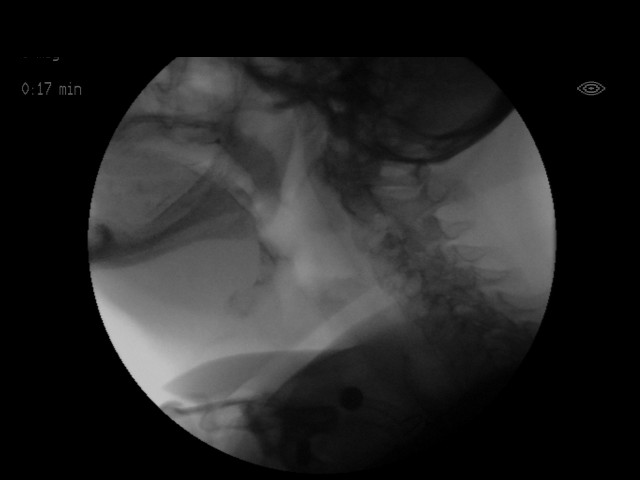
[im 5/9]
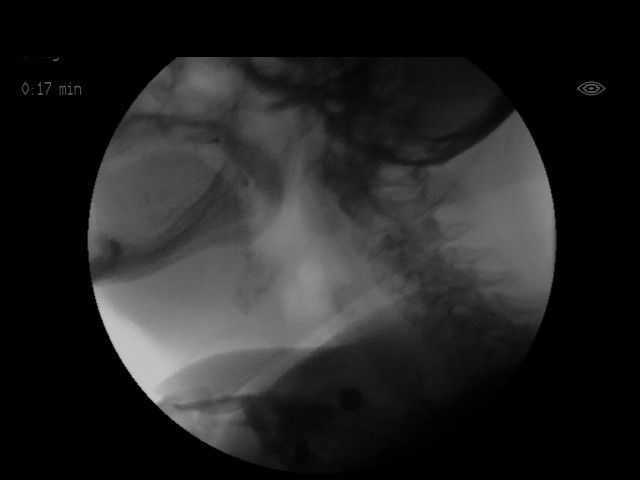
[im 6/9]
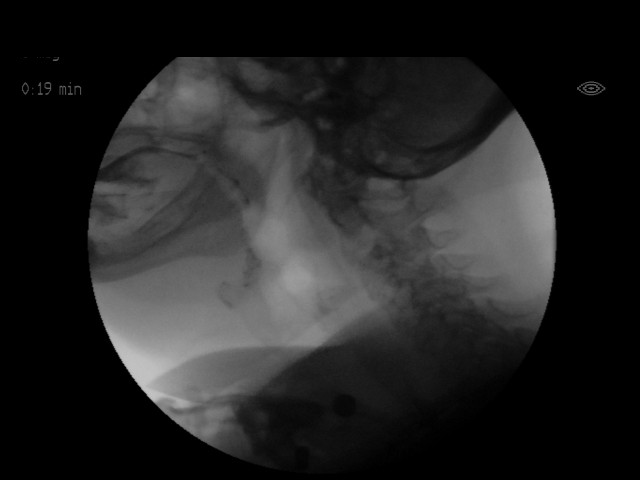
[im 6/9]
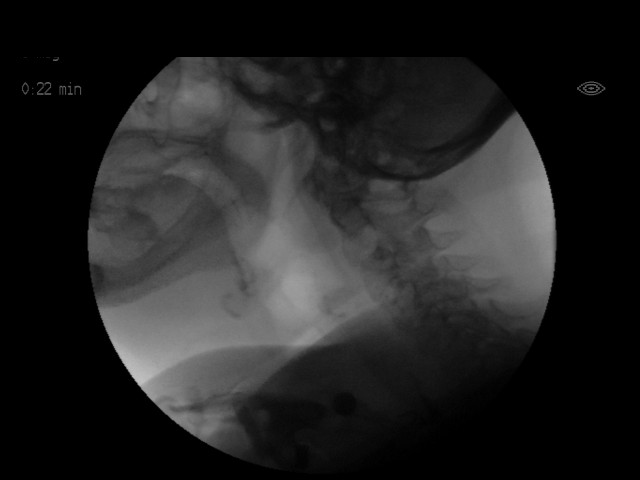
[im 7/9]
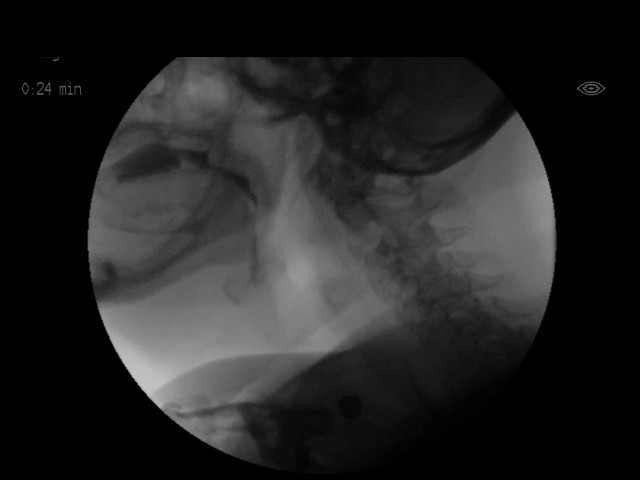
[im 7/9]
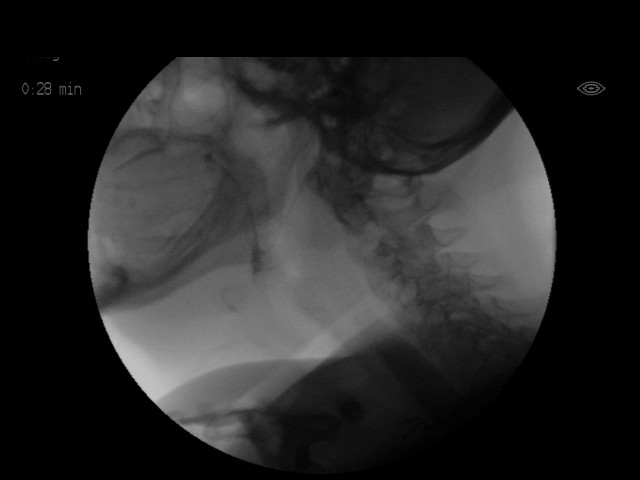
[im 8/9]
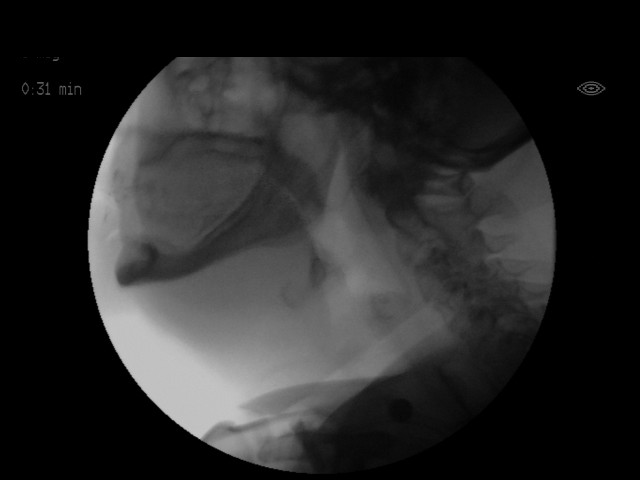
[im 8/9]
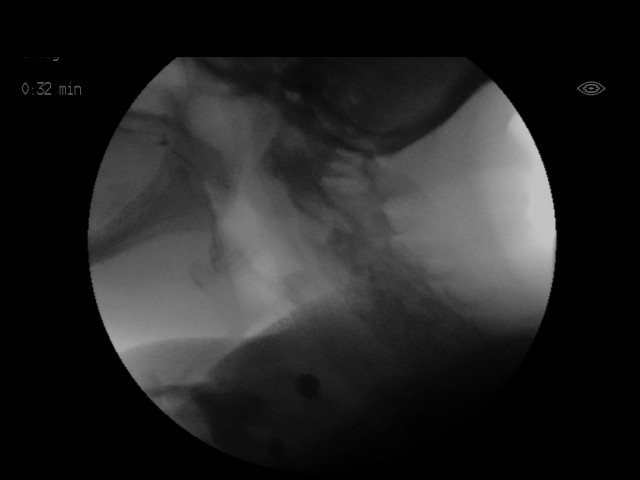
[im 9/9]
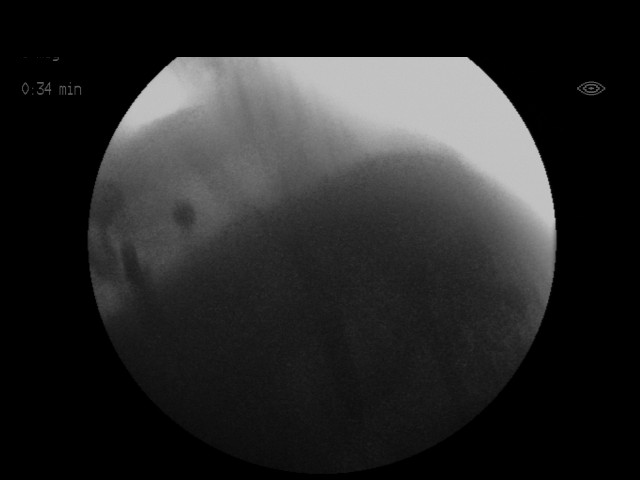
[im 9/9]
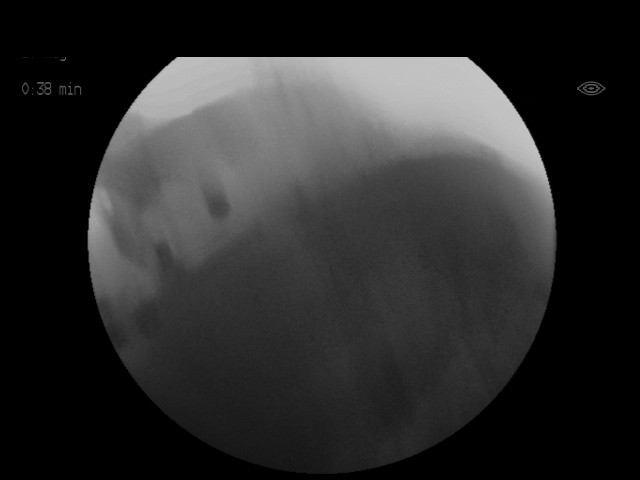

[18 of 24 positions shown; findings below may reference images not displayed]

FLUOROSCOPY FOR SWALLOWING FUNCTION STUDY:
Fluoroscopy was provided for swallowing function study, which was administered by a speech pathologist.  Final results and recommendations from this study are contained within the speech pathology report.

## 2024-03-14 ENCOUNTER — Other Ambulatory Visit: Payer: Self-pay | Admitting: Interventional Radiology

## 2024-03-14 DIAGNOSIS — M170 Bilateral primary osteoarthritis of knee: Secondary | ICD-10-CM

## 2024-04-03 ENCOUNTER — Ambulatory Visit: Admission: RE | Admit: 2024-04-03 | Source: Ambulatory Visit
